# Patient Record
Sex: Male | Born: 1970 | Race: White | Hispanic: No | Marital: Single | Smoking: Current every day smoker
Health system: Southern US, Community
[De-identification: ages and names within clinical notes are randomized; demographics above are authoritative.]

## PROBLEM LIST (undated history)

## (undated) DIAGNOSIS — R519 Headache, unspecified: Secondary | ICD-10-CM

## (undated) DIAGNOSIS — R002 Palpitations: Secondary | ICD-10-CM

## (undated) DIAGNOSIS — Z8719 Personal history of other diseases of the digestive system: Secondary | ICD-10-CM

## (undated) DIAGNOSIS — R0600 Dyspnea, unspecified: Secondary | ICD-10-CM

## (undated) DIAGNOSIS — I509 Heart failure, unspecified: Secondary | ICD-10-CM

## (undated) DIAGNOSIS — K4090 Unilateral inguinal hernia, without obstruction or gangrene, not specified as recurrent: Secondary | ICD-10-CM

## (undated) DIAGNOSIS — F419 Anxiety disorder, unspecified: Secondary | ICD-10-CM

## (undated) DIAGNOSIS — J189 Pneumonia, unspecified organism: Secondary | ICD-10-CM

## (undated) DIAGNOSIS — F32A Depression, unspecified: Secondary | ICD-10-CM

## (undated) DIAGNOSIS — I499 Cardiac arrhythmia, unspecified: Secondary | ICD-10-CM

## (undated) HISTORY — PX: HERNIA REPAIR: SHX51

---

## 2005-09-10 ENCOUNTER — Emergency Department (HOSPITAL_COMMUNITY): Admission: EM | Admit: 2005-09-10 | Discharge: 2005-09-11 | Payer: Self-pay | Admitting: Emergency Medicine

## 2009-01-28 ENCOUNTER — Emergency Department (HOSPITAL_COMMUNITY): Admission: EM | Admit: 2009-01-28 | Discharge: 2009-01-29 | Payer: Self-pay | Admitting: Emergency Medicine

## 2010-08-22 LAB — COMPREHENSIVE METABOLIC PANEL
ALT: 29 U/L (ref 0–53)
Albumin: 4.5 g/dL (ref 3.5–5.2)
Alkaline Phosphatase: 76 U/L (ref 39–117)
Calcium: 9.9 mg/dL (ref 8.4–10.5)
Glucose, Bld: 84 mg/dL (ref 70–99)
Potassium: 3.8 mEq/L (ref 3.5–5.1)
Sodium: 138 mEq/L (ref 135–145)
Total Protein: 7.4 g/dL (ref 6.0–8.3)

## 2010-08-22 LAB — HEMOCCULT GUIAC POC 1CARD (OFFICE): Fecal Occult Bld: POSITIVE

## 2010-08-22 LAB — CBC
MCHC: 34.9 g/dL (ref 30.0–36.0)
Platelets: 140 10*3/uL — ABNORMAL LOW (ref 150–400)
RDW: 13.5 % (ref 11.5–15.5)

## 2010-08-22 LAB — DIFFERENTIAL
Basophils Relative: 1 % (ref 0–1)
Lymphs Abs: 1.8 10*3/uL (ref 0.7–4.0)
Monocytes Absolute: 0.9 10*3/uL (ref 0.1–1.0)
Monocytes Relative: 9 % (ref 3–12)
Neutro Abs: 6.2 10*3/uL (ref 1.7–7.7)
Neutrophils Relative %: 68 % (ref 43–77)

## 2011-02-17 ENCOUNTER — Emergency Department (HOSPITAL_COMMUNITY): Payer: Self-pay

## 2011-02-17 ENCOUNTER — Emergency Department (HOSPITAL_COMMUNITY)
Admission: EM | Admit: 2011-02-17 | Discharge: 2011-02-18 | Disposition: A | Discharge status: Home / Self Care | Payer: Self-pay | Attending: Surgery | Admitting: Surgery

## 2011-02-17 DIAGNOSIS — S46909A Unspecified injury of unspecified muscle, fascia and tendon at shoulder and upper arm level, unspecified arm, initial encounter: Secondary | ICD-10-CM | POA: Insufficient documentation

## 2011-02-17 DIAGNOSIS — S41109A Unspecified open wound of unspecified upper arm, initial encounter: Secondary | ICD-10-CM | POA: Insufficient documentation

## 2011-02-17 DIAGNOSIS — IMO0002 Reserved for concepts with insufficient information to code with codable children: Secondary | ICD-10-CM | POA: Insufficient documentation

## 2011-02-17 DIAGNOSIS — S4980XA Other specified injuries of shoulder and upper arm, unspecified arm, initial encounter: Secondary | ICD-10-CM | POA: Insufficient documentation

## 2011-02-17 LAB — COMPREHENSIVE METABOLIC PANEL
ALT: 11 U/L (ref 0–53)
Alkaline Phosphatase: 81 U/L (ref 39–117)
CO2: 22 mEq/L (ref 19–32)
Chloride: 98 mEq/L (ref 96–112)
GFR calc Af Amer: 86 mL/min — ABNORMAL LOW (ref >90)
Glucose, Bld: 90 mg/dL (ref 70–99)
Potassium: 3.1 mEq/L — ABNORMAL LOW (ref 3.5–5.1)
Sodium: 137 mEq/L (ref 135–145)
Total Bilirubin: 0.2 mg/dL — ABNORMAL LOW (ref 0.3–1.2)
Total Protein: 7.4 g/dL (ref 6.0–8.3)

## 2011-02-17 LAB — URINALYSIS, ROUTINE W REFLEX MICROSCOPIC
Bilirubin Urine: NEGATIVE
Glucose, UA: NEGATIVE mg/dL
Hgb urine dipstick: NEGATIVE
Specific Gravity, Urine: 1.006 (ref 1.005–1.030)
Urobilinogen, UA: 0.2 mg/dL (ref 0.0–1.0)
pH: 6 (ref 5.0–8.0)

## 2011-02-17 LAB — CBC
HCT: 40.4 % (ref 39.0–52.0)
Hemoglobin: 14.7 g/dL (ref 13.0–17.0)
MCH: 33.6 pg (ref 26.0–34.0)
MCHC: 36.4 g/dL — ABNORMAL HIGH (ref 30.0–36.0)
MCV: 92.2 fL (ref 78.0–100.0)
RDW: 12.6 % (ref 11.5–15.5)

## 2011-02-17 LAB — POCT I-STAT, CHEM 8
Calcium, Ion: 1.1 mmol/L — ABNORMAL LOW (ref 1.12–1.32)
Chloride: 100 meq/L (ref 96–112)
Creatinine, Ser: 1.4 mg/dL — ABNORMAL HIGH (ref 0.50–1.35)
Glucose, Bld: 87 mg/dL (ref 70–99)
Potassium: 3.3 meq/L — ABNORMAL LOW (ref 3.5–5.1)

## 2011-02-17 LAB — RAPID URINE DRUG SCREEN, HOSP PERFORMED
Barbiturates: NOT DETECTED
Benzodiazepines: NOT DETECTED

## 2011-02-17 MED ORDER — IOHEXOL 300 MG/ML  SOLN
100.0000 mL | Freq: Once | INTRAMUSCULAR | Status: AC | PRN
  Administered 2011-02-17: 100 mL via INTRAVENOUS

## 2011-02-23 ENCOUNTER — Telehealth (INDEPENDENT_AMBULATORY_CARE_PROVIDER_SITE_OTHER): Payer: Self-pay | Admitting: Orthopedic Surgery

## 2011-02-23 NOTE — Telephone Encounter (Signed)
Spouse called for follow-up. Left message to call the ED as we did not see nor were we contacted about care.

## 2013-05-14 IMAGING — CT CT CHEST W/ CM
2 of 4 series · 15 of 36 positions shown, 18 images · IV contrast (80ml omni 300)
Comparison: AP chest radiograph - earlier same day; CT abdomen
pelvis - 01/29/2009

CLINICAL DATA: Stuck with tree branch in left axilla

CT CHEST WITH CONTRAST
TECHNIQUE: Multidetector CT imaging of the chest was performed
following the standard protocol during bolus administration of
intravenous contrast.
Contrast: 100mL OMNIPAQUE IOHEXOL 300 MG/ML IV SOLN

[Series 2: routine chest · axial · 0.75mm/px · z∈[-322,-28]mm · 12 of 69 slices shown, 15 images]
[im 5/69  mediastinal]
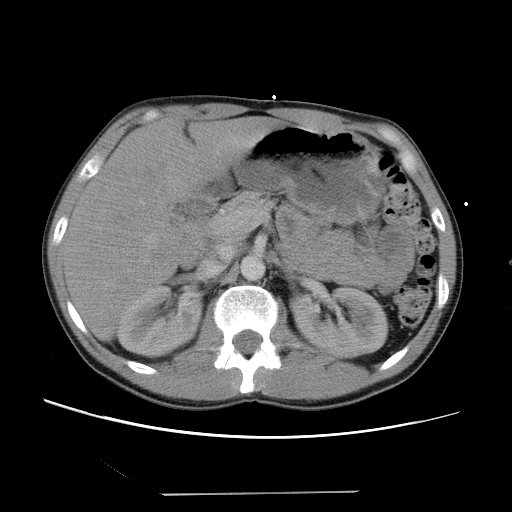
[im 5/69  lung]
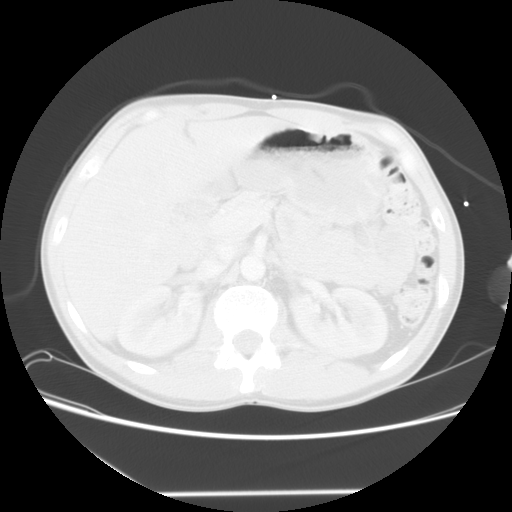
[im 10/69  lung]
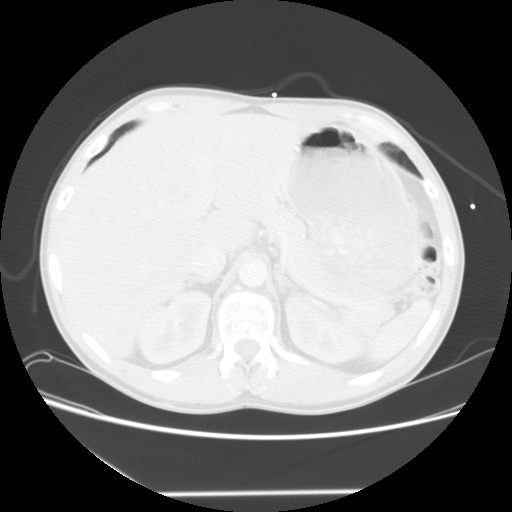
[im 15/69  lung]
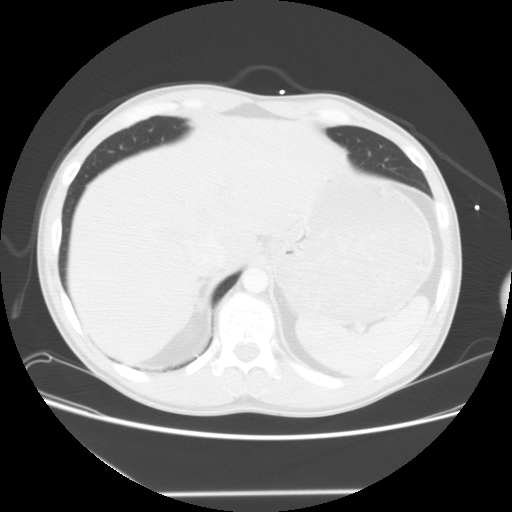
[im 20/69  lung]
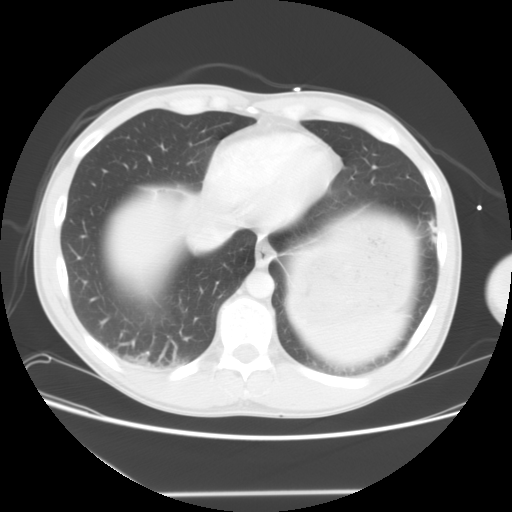
[im 25/69  mediastinal]
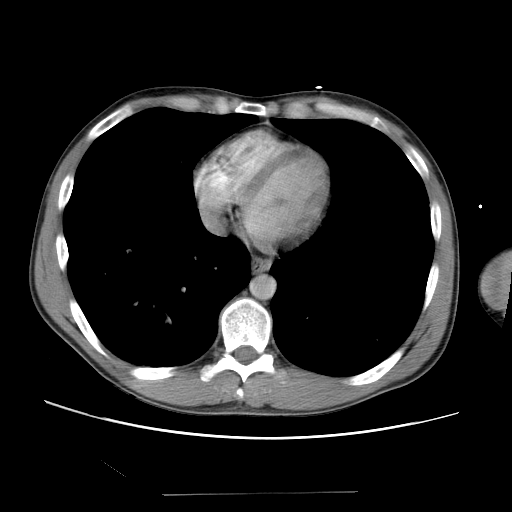
[im 25/69  lung]
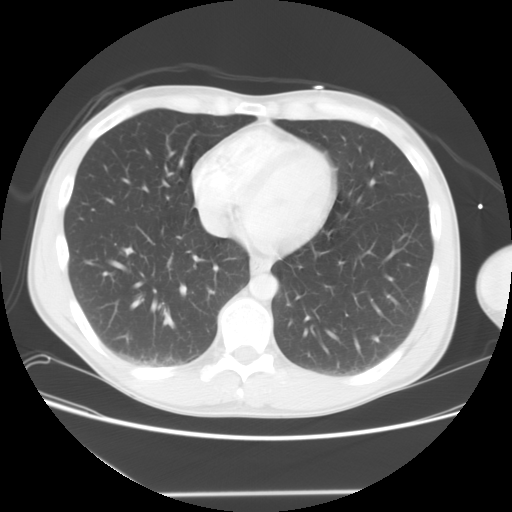
[im 30/69  lung]
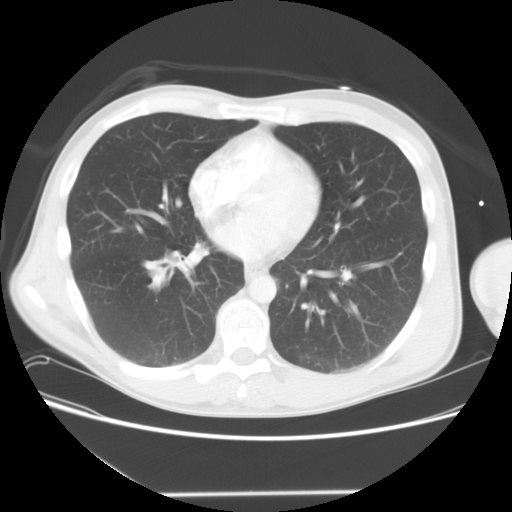
[im 39/69  lung]
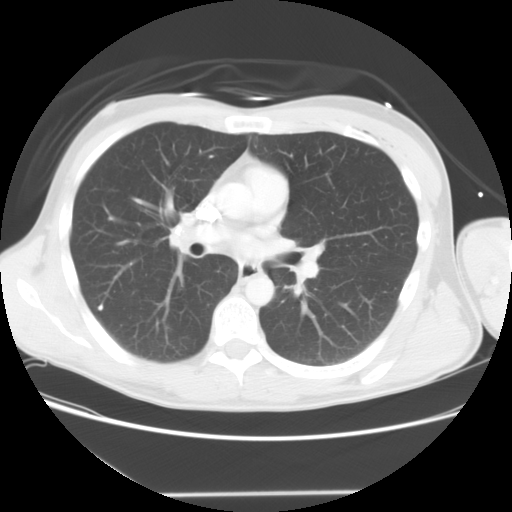
[im 44/69  lung]
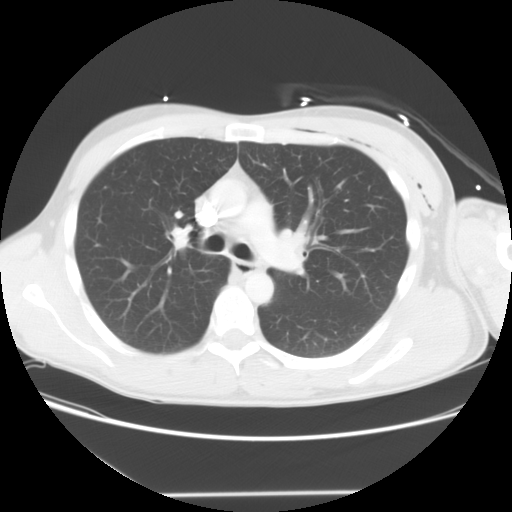
[im 49/69  mediastinal]
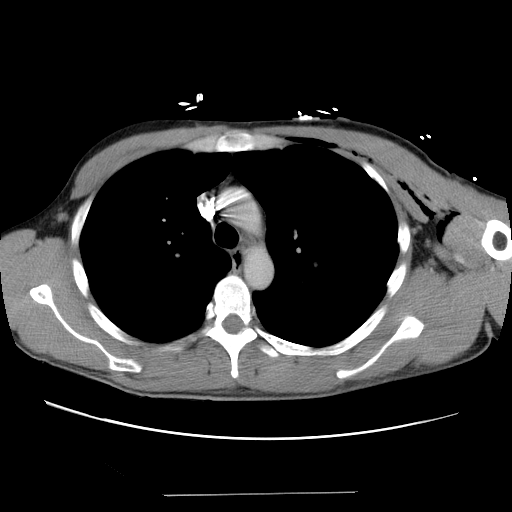
[im 49/69  lung]
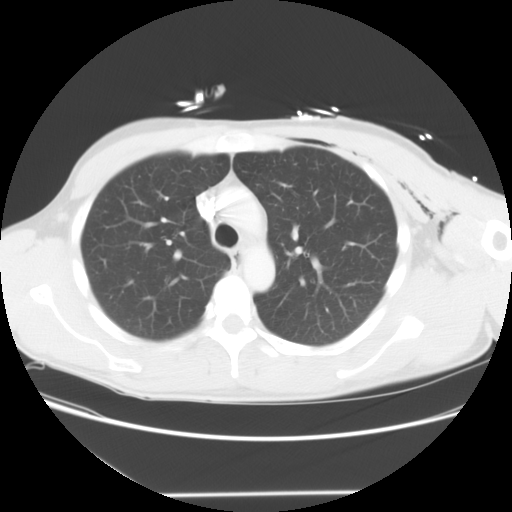
[im 54/69  lung]
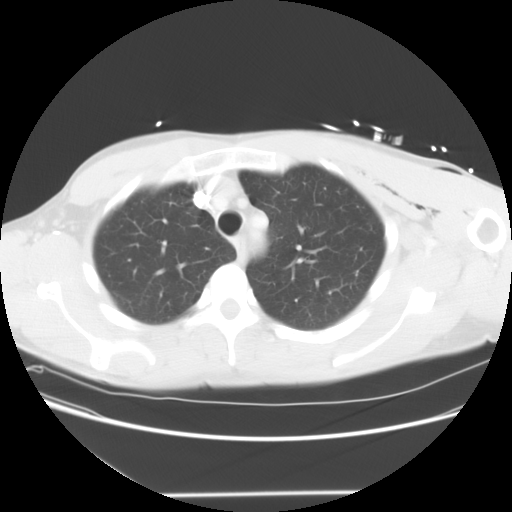
[im 59/69  lung]
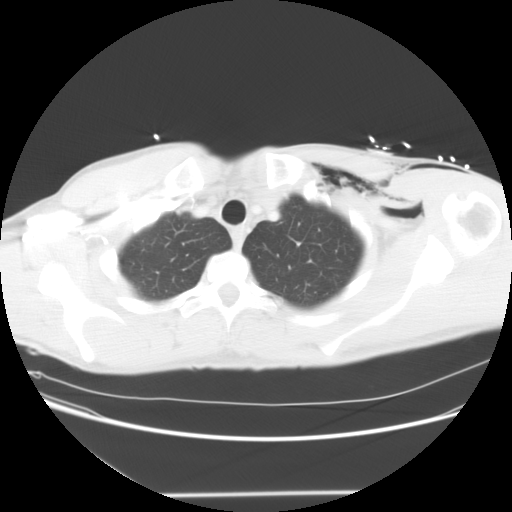
[im 64/69  lung]
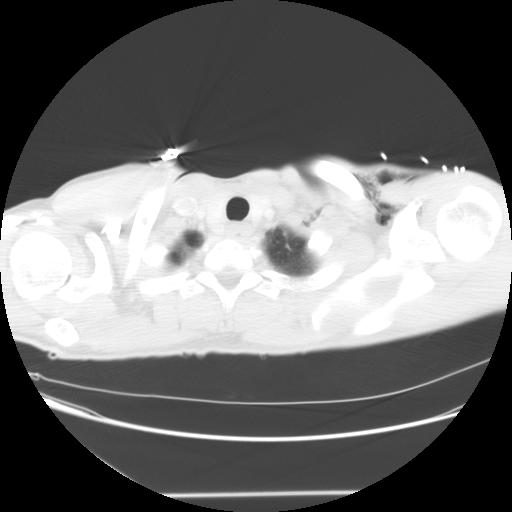

[Series 401: cor · coronal · 0.75mm/px · 3 of 87 slices shown]
[im 18/87  lung]
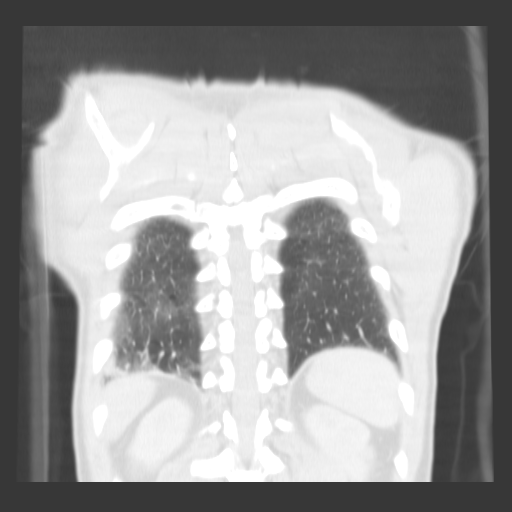
[im 35/87  lung]
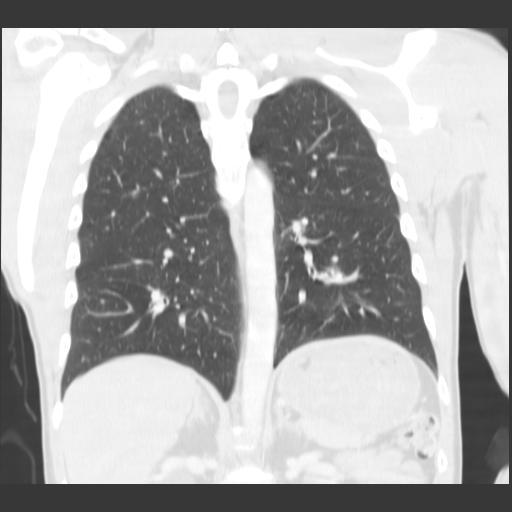
[im 52/87  lung]
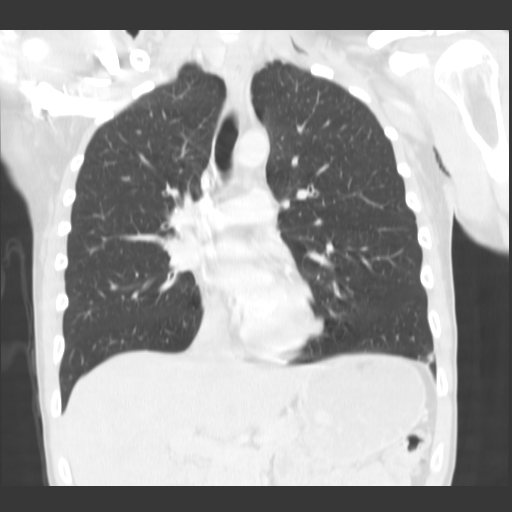

[15 of 36 positions shown; findings below may reference images not displayed]

FINDINGS: There is a moderate amount of subcutaneous emphysema about the left
axilla extending along the anterior aspect of the left chest and to
the left supraclavicular region.  No associated pneumothorax.  No
discrete radiopaque foreign body.  No displaced rib fractures.
There is no discrete area of active extravasation within the left
axilla.

Calcified granuloma within right upper lobe with associated with
partially calcified right hilar lymph nodes, the sequela of prior
granulomatous infection.  Minimal bibasilar dependent ground-glass
atelectasis.  No focal airspace opacities.  No pleural effusion.
Central airways are patent.

Normal heart size.  No pericardial effusion.  No mediastinal, hilar
or axillary lymphadenopathy. Normal configuration of the aortic
arch.  Normal caliber thoracic aorta.

Limited early phase evaluation of the upper abdomen suggest mild
dilatation of the bilateral renal collecting systems without
definite evidence of hydronephrosis.
IMPRESSION: 1.  Moderate amount of subcutaneous emphysema within the left
axilla without associated pneumothorax, fracture or radiopaque
foreign body.

2. Sequela of prior granulomatous infection.

## 2017-11-25 ENCOUNTER — Ambulatory Visit (HOSPITAL_BASED_OUTPATIENT_CLINIC_OR_DEPARTMENT_OTHER): Payer: Self-pay

## 2017-11-25 ENCOUNTER — Other Ambulatory Visit: Payer: Self-pay

## 2017-11-25 ENCOUNTER — Encounter (HOSPITAL_BASED_OUTPATIENT_CLINIC_OR_DEPARTMENT_OTHER): Payer: Self-pay | Admitting: Emergency Medicine

## 2017-11-25 ENCOUNTER — Emergency Department (HOSPITAL_BASED_OUTPATIENT_CLINIC_OR_DEPARTMENT_OTHER): Payer: Self-pay

## 2017-11-25 ENCOUNTER — Emergency Department (HOSPITAL_BASED_OUTPATIENT_CLINIC_OR_DEPARTMENT_OTHER)
Admission: EM | Admit: 2017-11-25 | Discharge: 2017-11-25 | Discharge status: Against Medical Advice | Payer: Self-pay | Attending: Emergency Medicine | Admitting: Emergency Medicine

## 2017-11-25 DIAGNOSIS — M25551 Pain in right hip: Secondary | ICD-10-CM | POA: Insufficient documentation

## 2017-11-25 DIAGNOSIS — Z5321 Procedure and treatment not carried out due to patient leaving prior to being seen by health care provider: Secondary | ICD-10-CM | POA: Insufficient documentation

## 2017-11-25 HISTORY — DX: Unilateral inguinal hernia, without obstruction or gangrene, not specified as recurrent: K40.90

## 2017-11-25 HISTORY — DX: Palpitations: R00.2

## 2017-11-25 NOTE — ED Triage Notes (Signed)
Pt states he fell off a ladder yesterday while cleaning the gutters out  Pt states he was approximately 12-15 feet up PT has bruising to his right hip, right foot, and back pain  Denies head injury or LOC

## 2017-11-29 NOTE — ED Notes (Signed)
Follow up call made  No answer  11/29/17  0835  s Benyamin Jeff rn

## 2020-09-16 ENCOUNTER — Encounter (HOSPITAL_COMMUNITY)
Admission: EM | Disposition: A | Discharge status: Home / Self Care | Payer: Self-pay | Source: Home / Self Care | Attending: Emergency Medicine

## 2020-09-16 ENCOUNTER — Emergency Department (HOSPITAL_BASED_OUTPATIENT_CLINIC_OR_DEPARTMENT_OTHER): Payer: No Typology Code available for payment source

## 2020-09-16 ENCOUNTER — Ambulatory Visit (HOSPITAL_BASED_OUTPATIENT_CLINIC_OR_DEPARTMENT_OTHER)
Admission: EM | Admit: 2020-09-16 | Discharge: 2020-09-16 | Disposition: A | Discharge status: Home / Self Care | Payer: No Typology Code available for payment source | Attending: Emergency Medicine | Admitting: Emergency Medicine

## 2020-09-16 ENCOUNTER — Emergency Department (HOSPITAL_COMMUNITY): Payer: No Typology Code available for payment source | Admitting: Certified Registered Nurse Anesthetist

## 2020-09-16 ENCOUNTER — Other Ambulatory Visit: Payer: Self-pay

## 2020-09-16 ENCOUNTER — Encounter (HOSPITAL_BASED_OUTPATIENT_CLINIC_OR_DEPARTMENT_OTHER): Payer: Self-pay

## 2020-09-16 DIAGNOSIS — Z79899 Other long term (current) drug therapy: Secondary | ICD-10-CM | POA: Diagnosis not present

## 2020-09-16 DIAGNOSIS — Z20822 Contact with and (suspected) exposure to covid-19: Secondary | ICD-10-CM | POA: Diagnosis not present

## 2020-09-16 DIAGNOSIS — W268XXA Contact with other sharp object(s), not elsewhere classified, initial encounter: Secondary | ICD-10-CM | POA: Insufficient documentation

## 2020-09-16 DIAGNOSIS — F17210 Nicotine dependence, cigarettes, uncomplicated: Secondary | ICD-10-CM | POA: Diagnosis not present

## 2020-09-16 DIAGNOSIS — Z23 Encounter for immunization: Secondary | ICD-10-CM | POA: Diagnosis not present

## 2020-09-16 DIAGNOSIS — Z880 Allergy status to penicillin: Secondary | ICD-10-CM | POA: Insufficient documentation

## 2020-09-16 DIAGNOSIS — Y9389 Activity, other specified: Secondary | ICD-10-CM | POA: Insufficient documentation

## 2020-09-16 DIAGNOSIS — Y990 Civilian activity done for income or pay: Secondary | ICD-10-CM | POA: Diagnosis not present

## 2020-09-16 DIAGNOSIS — S61441A Puncture wound with foreign body of right hand, initial encounter: Secondary | ICD-10-CM | POA: Diagnosis not present

## 2020-09-16 DIAGNOSIS — Z7982 Long term (current) use of aspirin: Secondary | ICD-10-CM | POA: Insufficient documentation

## 2020-09-16 DIAGNOSIS — S60551A Superficial foreign body of right hand, initial encounter: Secondary | ICD-10-CM

## 2020-09-16 DIAGNOSIS — W458XXA Other foreign body or object entering through skin, initial encounter: Secondary | ICD-10-CM | POA: Insufficient documentation

## 2020-09-16 HISTORY — DX: Personal history of other diseases of the digestive system: Z87.19

## 2020-09-16 HISTORY — DX: Heart failure, unspecified: I50.9

## 2020-09-16 HISTORY — DX: Headache, unspecified: R51.9

## 2020-09-16 HISTORY — DX: Cardiac arrhythmia, unspecified: I49.9

## 2020-09-16 HISTORY — DX: Anxiety disorder, unspecified: F41.9

## 2020-09-16 HISTORY — DX: Pneumonia, unspecified organism: J18.9

## 2020-09-16 HISTORY — PX: I & D EXTREMITY: SHX5045

## 2020-09-16 HISTORY — DX: Dyspnea, unspecified: R06.00

## 2020-09-16 HISTORY — DX: Depression, unspecified: F32.A

## 2020-09-16 LAB — RESP PANEL BY RT-PCR (FLU A&B, COVID) ARPGX2
Influenza A by PCR: NEGATIVE
Influenza B by PCR: NEGATIVE
SARS Coronavirus 2 by RT PCR: NEGATIVE

## 2020-09-16 LAB — BASIC METABOLIC PANEL
Anion gap: 8 (ref 5–15)
BUN: 9 mg/dL (ref 6–20)
CO2: 26 mmol/L (ref 22–32)
Calcium: 9.3 mg/dL (ref 8.9–10.3)
Chloride: 102 mmol/L (ref 98–111)
Creatinine, Ser: 0.9 mg/dL (ref 0.61–1.24)
GFR, Estimated: >60 mL/min (ref >60)
Glucose, Bld: 81 mg/dL (ref 70–99)
Potassium: 4.2 mmol/L (ref 3.5–5.1)
Sodium: 136 mmol/L (ref 135–145)

## 2020-09-16 LAB — SURGICAL PCR SCREEN
MRSA, PCR: NEGATIVE
Staphylococcus aureus: NEGATIVE

## 2020-09-16 SURGERY — IRRIGATION AND DEBRIDEMENT EXTREMITY
Anesthesia: General | Laterality: Right

## 2020-09-16 MED ORDER — DEXAMETHASONE SODIUM PHOSPHATE 10 MG/ML IJ SOLN
INTRAMUSCULAR | Status: DC | PRN
  Administered 2020-09-16: 5 mg via INTRAVENOUS

## 2020-09-16 MED ORDER — FENTANYL CITRATE (PF) 250 MCG/5ML IJ SOLN
INTRAMUSCULAR | Status: AC
  Filled 2020-09-16: qty 10

## 2020-09-16 MED ORDER — PROPOFOL 10 MG/ML IV BOLUS
INTRAVENOUS | Status: AC
  Filled 2020-09-16: qty 20

## 2020-09-16 MED ORDER — MIDAZOLAM HCL 2 MG/2ML IJ SOLN
0.5000 mg | Freq: Once | INTRAMUSCULAR | Status: DC | PRN
Start: 2020-09-16 — End: 2020-09-16

## 2020-09-16 MED ORDER — ONDANSETRON HCL 4 MG/2ML IJ SOLN
INTRAMUSCULAR | Status: DC | PRN
  Administered 2020-09-16: 4 mg via INTRAVENOUS

## 2020-09-16 MED ORDER — FENTANYL CITRATE (PF) 250 MCG/5ML IJ SOLN
INTRAMUSCULAR | Status: DC | PRN
  Administered 2020-09-16 (×3): 50 ug via INTRAVENOUS

## 2020-09-16 MED ORDER — ORAL CARE MOUTH RINSE: 15.0000 mL | Freq: Once | OROMUCOSAL | Status: AC

## 2020-09-16 MED ORDER — CHLORHEXIDINE GLUCONATE 4 % EX LIQD: 60.0000 mL | Freq: Once | CUTANEOUS | Status: DC

## 2020-09-16 MED ORDER — BUPIVACAINE HCL (PF) 0.25 % IJ SOLN
INTRAMUSCULAR | Status: DC | PRN
  Administered 2020-09-16: 10 mL

## 2020-09-16 MED ORDER — LACTATED RINGERS IV SOLN: INTRAVENOUS | Status: DC

## 2020-09-16 MED ORDER — TETANUS-DIPHTH-ACELL PERTUSSIS 5-2.5-18.5 LF-MCG/0.5 IM SUSY
0.5000 mL | PREFILLED_SYRINGE | Freq: Once | INTRAMUSCULAR | Status: AC
  Administered 2020-09-16: 0.5 mL via INTRAMUSCULAR
  Filled 2020-09-16: qty 0.5

## 2020-09-16 MED ORDER — MIDAZOLAM HCL 2 MG/2ML IJ SOLN
INTRAMUSCULAR | Status: AC
  Filled 2020-09-16: qty 2

## 2020-09-16 MED ORDER — VANCOMYCIN HCL IN DEXTROSE 1-5 GM/200ML-% IV SOLN
INTRAVENOUS | Status: AC
  Administered 2020-09-16: 1000 mg via INTRAVENOUS
  Filled 2020-09-16: qty 200

## 2020-09-16 MED ORDER — LIDOCAINE 2% (20 MG/ML) 5 ML SYRINGE
INTRAMUSCULAR | Status: DC | PRN
  Administered 2020-09-16: 40 mg via INTRAVENOUS

## 2020-09-16 MED ORDER — BUPIVACAINE HCL (PF) 0.25 % IJ SOLN
INTRAMUSCULAR | Status: AC
  Filled 2020-09-16: qty 30

## 2020-09-16 MED ORDER — CHLORHEXIDINE GLUCONATE 0.12 % MT SOLN: 15.0000 mL | Freq: Once | OROMUCOSAL | Status: AC

## 2020-09-16 MED ORDER — OXYCODONE HCL 5 MG PO TABS: 5.0000 mg | ORAL_TABLET | Freq: Once | ORAL | Status: DC | PRN

## 2020-09-16 MED ORDER — HYDROMORPHONE HCL 1 MG/ML IJ SOLN: 0.2500 mg | INTRAMUSCULAR | Status: DC | PRN

## 2020-09-16 MED ORDER — MIDAZOLAM HCL 2 MG/2ML IJ SOLN
INTRAMUSCULAR | Status: DC | PRN
  Administered 2020-09-16: 2 mg via INTRAVENOUS

## 2020-09-16 MED ORDER — VASOPRESSIN 20 UNIT/ML IV SOLN
INTRAVENOUS | Status: AC
  Filled 2020-09-16: qty 1

## 2020-09-16 MED ORDER — MEPERIDINE HCL 25 MG/ML IJ SOLN: 6.2500 mg | INTRAMUSCULAR | Status: DC | PRN

## 2020-09-16 MED ORDER — POVIDONE-IODINE 10 % EX SWAB: 2.0000 "application " | Freq: Once | CUTANEOUS | Status: DC

## 2020-09-16 MED ORDER — PROPOFOL 10 MG/ML IV BOLUS
INTRAVENOUS | Status: DC | PRN
  Administered 2020-09-16: 30 mg via INTRAVENOUS
  Administered 2020-09-16: 100 mg via INTRAVENOUS

## 2020-09-16 MED ORDER — EPHEDRINE SULFATE-NACL 50-0.9 MG/10ML-% IV SOSY
PREFILLED_SYRINGE | INTRAVENOUS | Status: DC | PRN
  Administered 2020-09-16: 15 mg via INTRAVENOUS
  Administered 2020-09-16: 10 mg via INTRAVENOUS

## 2020-09-16 MED ORDER — VANCOMYCIN HCL IN DEXTROSE 1-5 GM/200ML-% IV SOLN: 1000.0000 mg | INTRAVENOUS | Status: AC

## 2020-09-16 MED ORDER — SODIUM CHLORIDE 0.9 % IR SOLN
Status: DC | PRN
  Administered 2020-09-16: 1000 mL

## 2020-09-16 MED ORDER — OXYCODONE HCL 5 MG/5ML PO SOLN: 5.0000 mg | Freq: Once | ORAL | Status: DC | PRN

## 2020-09-16 MED ORDER — CLINDAMYCIN HCL 300 MG PO CAPS: 300.0000 mg | ORAL_CAPSULE | Freq: Three times a day (TID) | ORAL | 0 refills | Status: AC

## 2020-09-16 MED ORDER — CHLORHEXIDINE GLUCONATE 0.12 % MT SOLN
OROMUCOSAL | Status: AC
  Administered 2020-09-16: 15 mL via OROMUCOSAL
  Filled 2020-09-16: qty 15

## 2020-09-16 MED ORDER — FENTANYL CITRATE (PF) 250 MCG/5ML IJ SOLN
INTRAMUSCULAR | Status: AC
  Filled 2020-09-16: qty 5

## 2020-09-16 MED ORDER — PROMETHAZINE HCL 25 MG/ML IJ SOLN: 6.2500 mg | INTRAMUSCULAR | Status: DC | PRN

## 2020-09-16 SURGICAL SUPPLY — 28 items
BAG DECANTER FOR FLEXI CONT (MISCELLANEOUS) ×2 IMPLANT
BNDG ELASTIC 3X5.8 VLCR STR LF (GAUZE/BANDAGES/DRESSINGS) ×1 IMPLANT
BNDG GAUZE ELAST 4 BULKY (GAUZE/BANDAGES/DRESSINGS) ×1 IMPLANT
DRAPE SURG 17X23 STRL (DRAPES) ×2 IMPLANT
GAUZE SPONGE 4X4 12PLY STRL (GAUZE/BANDAGES/DRESSINGS) ×2 IMPLANT
GAUZE XEROFORM 5X9 LF (GAUZE/BANDAGES/DRESSINGS) ×1 IMPLANT
GLOVE BIOGEL M 8.0 STRL (GLOVE) ×2 IMPLANT
GOWN STRL REUS W/ TWL LRG LVL3 (GOWN DISPOSABLE) ×2 IMPLANT
GOWN STRL REUS W/TWL LRG LVL3 (GOWN DISPOSABLE) ×4
HANDPIECE INTERPULSE COAX TIP (DISPOSABLE) ×2
KIT BASIN OR (CUSTOM PROCEDURE TRAY) ×2 IMPLANT
KIT TURNOVER KIT B (KITS) ×2 IMPLANT
NDL HYPO 25GX1X1/2 BEV (NEEDLE) IMPLANT
NEEDLE HYPO 25GX1X1/2 BEV (NEEDLE) ×2 IMPLANT
NS IRRIG 1000ML POUR BTL (IV SOLUTION) ×2 IMPLANT
PACK ORTHO EXTREMITY (CUSTOM PROCEDURE TRAY) ×2 IMPLANT
PAD ARMBOARD 7.5X6 YLW CONV (MISCELLANEOUS) ×4 IMPLANT
SET HNDPC FAN SPRY TIP SCT (DISPOSABLE) IMPLANT
SOAP 2 % CHG 4 OZ (WOUND CARE) ×2 IMPLANT
SPONGE LAP 4X18 RFD (DISPOSABLE) ×2 IMPLANT
SUT ETHILON 5 0 P 3 18 (SUTURE) ×2
SUT NYLON ETHILON 5-0 P-3 1X18 (SUTURE) IMPLANT
SYR CONTROL 10ML LL (SYRINGE) ×1 IMPLANT
TOWEL GREEN STERILE (TOWEL DISPOSABLE) ×2 IMPLANT
TOWEL GREEN STERILE FF (TOWEL DISPOSABLE) ×2 IMPLANT
TUBE CONNECTING 12X1/4 (SUCTIONS) ×2 IMPLANT
WATER STERILE IRR 1000ML POUR (IV SOLUTION) ×2 IMPLANT
YANKAUER SUCT BULB TIP NO VENT (SUCTIONS) ×2 IMPLANT

## 2020-09-16 NOTE — ED Provider Notes (Signed)
[facility] EMERGENCY DEPARTMENT Provider Note   CSN: [id number] Arrival date & time: 09/16/20  6213     History Chief Complaint  Patient presents with  . Hand Injury    Samuel Khan is a 50 y.o. male.  50 year old male who presents with right hand injury.  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  The wood broke off into his hand and he has not been able to get it out.  Pain is mild to moderate and constant.  He reports normal sensation.  He is right-handed.  No medications prior to arrival.  He does not know last tetanus vaccination.  Last food intake was yesterday at suppertime. He has had coffee this morning but no food.  The history is provided by the patient.  Hand Injury      Past Medical History:  Diagnosis Date  . Hernia of testicle   . Palpitations     There are no problems to display for this patient.   Past Surgical History:  Procedure Laterality Date  . HERNIA REPAIR         No family history on file.  Social History   Tobacco Use  . Smoking status: Current Every Day Smoker    Packs/day: 1.50    Types: Cigarettes  . Smokeless tobacco: Never Used  Substance Use Topics  . Alcohol use: Not Currently  . Drug use: Yes    Types: Marijuana    Home Medications Prior to Admission medications   Medication Sig Start Date End Date Taking? Authorizing Provider  aspirin 81 MG EC tablet Take 1 tablet by mouth daily. 05/20/20  Yes [provider]  metoprolol tartrate (LOPRESSOR) 100 MG tablet Take by mouth. 05/20/20  Yes [provider]    Allergies    Ampicillin and Penicillins  Review of Systems   Review of Systems All other systems reviewed and are negative except that which was mentioned in HPI  Physical Exam Updated Vital Signs BP (!) 135/104 (BP Location: Right Arm)   Pulse (!) 58   Temp 97.6 F (36.4 C) (Oral)   Resp 16   Ht 6' (1.829  m)   Wt 70.3 kg   SpO2 97%   BMI 21.02 kg/m   Physical Exam Vitals and nursing note reviewed.  Constitutional:      General: He is not in acute distress.    Appearance: He is well-developed.  HENT:     Head: Normocephalic and atraumatic.  Eyes:     Conjunctiva/sclera: Conjunctivae normal.  Cardiovascular:     Pulses: Normal pulses.  Musculoskeletal:        General: Normal range of motion.     Cervical back: Neck supple.     Comments: Puncture wound in web space between 3rd and 4th fingers on R hand; linear mass palpable under skin on palm just proximal to web space; normal ROM and sensation fingers  Skin:    General: Skin is warm and dry.  Neurological:     Mental Status: He is alert and oriented to person, place, and time.  Psychiatric:        Judgment: Judgment normal.     ED Results / Procedures / Treatments   Labs (all labs ordered are listed, but only abnormal results are displayed) Labs Reviewed  RESP PANEL BY RT-PCR (FLU A&B, COVID) [id number]    EKG None  Radiology DG  Hand Complete Right  Result Date: 09/16/2020 CLINICAL DATA:  Piece of wood broke off in RIGHT hand, into palm between RIGHT ring and middle fingers EXAM: RIGHT HAND - COMPLETE 3+ VIEW COMPARISON:  None FINDINGS: Radiopaque foreign body 4 mm length at first web space. No other radiopaque foreign bodies identified. Soft tissue gas identified at the radial aspect, base of proximal phalanx ring finger. Osseous mineralization normal. Old healed fracture distal RIGHT fifth metacarpal. No acute fracture, dislocation, or bone destruction. Scattered degenerative changes of IP joints. IMPRESSION: Soft tissue gas identified at radial aspect base of proximal phalanx RIGHT ring finger without associated radiopaque foreign body. Please note that wood is typically radiolucent. 4 mm radiopaque foreign body at first web space. Old healed fifth metacarpal fracture. Electronically Signed   By: Ulyses Southward M.D.   On:  09/16/2020 08:52    Procedures Procedures   Medications Ordered in ED Medications  Tdap (BOOSTRIX) injection 0.5 mL (0.5 mLs Intramuscular Given 09/16/20 0932)    ED Course  I have reviewed the triage vital signs and the nursing notes.  Pertinent labs & imaging results that were available during my care of the patient were reviewed by me and considered in my medical decision making (see chart for details).    MDM Rules/Calculators/A&P                          Neurovascularly intact.  Plain films show no underlying fracture, some gas tracking along puncture site and foreign body is palpable on physical exam.  Suspect he will require surgical removal of this foreign body.  Updated tetanus and discussed with hand surgeon on-call, Izora Ribas, who will see pt in [facility] ED. discussed ED to ED transfer with Dr. Audley Hose.  Last food intake was before midnight last night; he has had some coffee this morning. COVID screening sent. I have discussed that he needs to remain strict n.p.o. prior to surgeon evaluation.  He is stable for private vehicle transfer and patient will be transported by his boss.  Final Clinical Impression(s) / ED Diagnoses Final diagnoses:  Foreign body of right hand, initial encounter    Rx / DC Orders ED Discharge Orders    None       Crespin Forstrom, Ambrose Finland, MD 09/16/20 1013

## 2020-09-16 NOTE — Progress Notes (Signed)
I have seen and examined this patient, agree with PA Leotis Shames.  Will proceed with FB removal, washout. Discussed procedure and risk of infection.

## 2020-09-16 NOTE — Consult Note (Signed)
Reason for Consult:Right hand FB Referring Physician: Cordelia Poche Time called: 1057 Time at bedside: 1243   Samuel Khan is an 50 y.o. male.  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 Although it didn't hurt terribly he could feel the piece of wood in his hand and came to the ED for evaluation and hand surgery was consulted. He is RHD.  Past Medical History:  Diagnosis Date  . Hernia of testicle   . Palpitations     Past Surgical History:  Procedure Laterality Date  . HERNIA REPAIR      No family history on file.  Social History:  reports that he has been smoking cigarettes. He has been smoking about 1.50 packs per day. He has never used smokeless tobacco. He reports previous alcohol use. He reports current drug use. Drug: Marijuana.  Allergies:  Allergies  Allergen Reactions  . Ampicillin   . Penicillins     Medications: I have reviewed the patient's current medications.  Results for orders placed or performed during the hospital encounter of 09/16/20 (from the past 48 hour(s))  Resp Panel by RT-PCR (Flu A&B, Covid) Nasopharyngeal Swab     Status: None   Collection Time: 09/16/20  9:30 AM   Specimen: Nasopharyngeal Swab; Nasopharyngeal(NP) swabs in vial transport medium  Result Value Ref Range   SARS Coronavirus 2 by RT PCR NEGATIVE NEGATIVE    Comment: (NOTE) SARS-CoV-2 target nucleic acids are NOT DETECTED.  The SARS-CoV-2 RNA is generally detectable in upper respiratory specimens during the acute phase of infection. The lowest concentration of SARS-CoV-2 viral copies this assay can detect is 138 copies/mL. A negative result does not preclude SARS-Cov-2 infection and should not be used as the sole basis for treatment or other patient management decisions. A negative result may occur with  improper specimen collection/handling, submission of specimen other than nasopharyngeal  swab, presence of viral mutation(s) within the areas targeted by this assay, and inadequate number of viral copies(<138 copies/mL). A negative result must be combined with clinical observations, patient history, and epidemiological information. The expected result is Negative.  Fact Sheet for Patients:  BloggerCourse.com  Fact Sheet for Healthcare Providers:  SeriousBroker.it  This test is no t yet approved or cleared by the [place] FDA and  has been authorized for detection and/or diagnosis of SARS-CoV-2 by FDA under an Emergency Use Authorization (EUA). This EUA will remain  in effect (meaning this test can be used) for the duration of the COVID-19 declaration under Section 564(b)(1) of the Act, 21 U.S.C.section 360bbb-3(b)(1), unless the authorization is terminated  or revoked sooner.       Influenza A by PCR NEGATIVE NEGATIVE   Influenza B by PCR NEGATIVE NEGATIVE    Comment: (NOTE) The Xpert Xpress SARS-CoV-2/FLU/RSV plus assay is intended as an aid in the diagnosis of influenza from Nasopharyngeal swab specimens and should not be used as a sole basis for treatment. Nasal washings and aspirates are unacceptable for Xpert Xpress SARS-CoV-2/FLU/RSV testing.  Fact Sheet for Patients: BloggerCourse.com  Fact Sheet for Healthcare Providers: SeriousBroker.it  This test is not yet approved or cleared by the [place] FDA and has been authorized for detection and/or diagnosis of SARS-CoV-2 by FDA under an Emergency Use Authorization (EUA). This EUA will remain in effect (meaning this test can be used) for the duration of the COVID-19 declaration under Section 564(b)(1) of the Act,  21 U.S.C. section 360bbb-3(b)(1), unless the authorization is terminated or revoked.  Performed at [facility], [address] [place], [place] [address]     DG Hand  Complete Right  Result Date: 09/16/2020 CLINICAL DATA:  Piece of wood broke off in RIGHT hand, into palm between RIGHT ring and middle fingers EXAM: RIGHT HAND - COMPLETE 3+ VIEW COMPARISON:  None FINDINGS: Radiopaque foreign body 4 mm length at first web space. No other radiopaque foreign bodies identified. Soft tissue gas identified at the radial aspect, base of proximal phalanx ring finger. Osseous mineralization normal. Old healed fracture distal RIGHT fifth metacarpal. No acute fracture, dislocation, or bone destruction. Scattered degenerative changes of IP joints. IMPRESSION: Soft tissue gas identified at radial aspect base of proximal phalanx RIGHT ring finger without associated radiopaque foreign body. Please note that wood is typically radiolucent. 4 mm radiopaque foreign body at first web space. Old healed fifth metacarpal fracture. Electronically Signed   By: Ulyses Southward M.D.   On: 09/16/2020 08:52    Review of Systems  HENT: Negative for ear discharge, ear pain, hearing loss and tinnitus.   Eyes: Negative for photophobia and pain.  Respiratory: Negative for cough and shortness of breath.   Cardiovascular: Negative for chest pain.  Gastrointestinal: Negative for abdominal pain, nausea and vomiting.  Genitourinary: Negative for dysuria, flank pain, frequency and urgency.  Musculoskeletal: Positive for myalgias (Right hand). Negative for back pain and neck pain.  Neurological: Negative for dizziness and headaches.  Hematological: Does not bruise/bleed easily.  Psychiatric/Behavioral: The patient is not nervous/anxious.    Blood pressure 118/86, pulse (!) 47, temperature 98.5 F (36.9 C), temperature source Oral, resp. rate 16, height 6' (1.829 m), weight 70.3 kg, SpO2 95 %. Physical Exam Constitutional:      General: He is not in acute distress.    Appearance: He is well-developed. He is not diaphoretic.  HENT:     Head: Normocephalic and atraumatic.  Eyes:     General: No scleral  icterus.       Right eye: No discharge.        Left eye: No discharge.     Conjunctiva/sclera: Conjunctivae normal.  Cardiovascular:     Rate and Rhythm: Normal rate and regular rhythm.  Pulmonary:     Effort: Pulmonary effort is normal. No respiratory distress.  Musculoskeletal:     Cervical back: Normal range of motion.     Comments: Right shoulder, elbow, wrist, digits- Small wound base of dorsal ring finger, palpable FB palmar extending ~4cm, mild TTP, no instability, no blocks to motion  Sens  Ax/R/M/U intact  Mot   Ax/ R/ PIN/ M/ AIN/ U intact  Rad 2+  Skin:    General: Skin is warm and dry.  Neurological:     Mental Status: He is alert.  Psychiatric:        Behavior: Behavior normal.     Assessment/Plan: FB right hand -- Plan I&D, FB removal today by Dr. Izora Ribas. Anticipate discharge after surgery.    Freeman Caldron, PA-C Orthopedic Surgery [phone] 09/16/2020, 1:07 PM

## 2020-09-16 NOTE — Discharge Instructions (Signed)
In am, removed dressing, remove packing from wounds, then apply a small amount of antibiotic ointment on wounds and keep clean and covered.

## 2020-09-16 NOTE — ED Triage Notes (Signed)
Pt arrives with piece of wood broken off into right hand. States he was at work and piece went between his fingers breaking off into palm between ring and middle finger.

## 2020-09-16 NOTE — Transfer of Care (Signed)
Immediate Anesthesia Transfer of Care Note  Patient: Ardeen Fillers  Procedure(s) Performed: IRRIGATION AND DEBRIDEMENT RIGHT HAND (Right )  Patient Location: PACU  Anesthesia Type:General  Level of Consciousness: drowsy, patient cooperative and responds to stimulation  Airway & Oxygen Therapy: Patient Spontanous Breathing  Post-op Assessment: Report given to RN and Post -op Vital signs reviewed and stable  Post vital signs: Reviewed and stable  Last Vitals:  Vitals Value Taken Time  BP 130/82 09/16/20 1712  Temp    Pulse 64 09/16/20 1713  Resp 13 09/16/20 1713  SpO2 95 % 09/16/20 1713  Vitals shown include unvalidated device data.  Last Pain:  Vitals:   09/16/20 1411  TempSrc: Oral  PainSc:          Complications: No complications documented.

## 2020-09-16 NOTE — ED Notes (Signed)
Pt agrees to be driven by employer to [facility] ED to meet with hand surgeon.

## 2020-09-16 NOTE — Anesthesia Postprocedure Evaluation (Signed)
Anesthesia Post Note  Patient: Samuel Khan  Procedure(s) Performed: IRRIGATION AND DEBRIDEMENT RIGHT HAND (Right )     Patient location during evaluation: PACU Anesthesia Type: General Level of consciousness: awake and alert, patient cooperative and oriented Pain management: pain level controlled Vital Signs Assessment: post-procedure vital signs reviewed and stable Respiratory status: spontaneous breathing, nonlabored ventilation and respiratory function stable Cardiovascular status: blood pressure returned to baseline and stable Postop Assessment: no apparent nausea or vomiting Anesthetic complications: no   No complications documented.  Last Vitals:  Vitals:   09/16/20 1411 09/16/20 1715  BP: 121/81 130/82  Pulse: 68 65  Resp: 16 13  Temp: 36.7 C 36.6 C  SpO2: 100% 97%    Last Pain:  Vitals:   09/16/20 1715  TempSrc:   PainSc: 0-No pain                 Simranjit Thayer,E. Bianna Haran

## 2020-09-16 NOTE — Anesthesia Procedure Notes (Signed)
Procedure Name: LMA Insertion Date/Time: 09/16/2020 4:42 PM Performed by: Drema Pry, CRNA Pre-anesthesia Checklist: Patient identified, Emergency Drugs available, Suction available and Patient being monitored Patient Re-evaluated:Patient Re-evaluated prior to induction Oxygen Delivery Method: Circle System Utilized Preoxygenation: Pre-oxygenation with 100% oxygen Induction Type: IV induction Ventilation: Mask ventilation without difficulty LMA: LMA inserted LMA Size: 4.0 Number of attempts: 1 Airway Equipment and Method: Bite block Placement Confirmation: positive ETCO2 Tube secured with: Tape Dental Injury: Teeth and Oropharynx as per pre-operative assessment

## 2020-09-16 NOTE — ED Notes (Signed)
ED Provider at bedside. 

## 2020-09-16 NOTE — ED Provider Notes (Signed)
Patient transferred from [facility] for right hand injury.  He has large wooden foreign body between the third and fourth fingers.  I have consulted with orthopedics who will assume care of the patient for appropriate foreign body removal.   Arthor Captain, PA-C 09/17/20 1331    Lorre Nick, MD 09/18/20 1536

## 2020-09-16 NOTE — Anesthesia Preprocedure Evaluation (Addendum)
Anesthesia Evaluation  Patient identified by MRN, date of birth, ID band Patient awake    Reviewed: Allergy & Precautions, NPO status , Patient's Chart, lab work & pertinent test results, reviewed documented beta blocker date and time   History of Anesthesia Complications Negative for: history of anesthetic complications  Airway Mallampati: II  TM Distance: >3 FB Neck ROM: Full    Dental  (+) Poor Dentition, Missing, Chipped, Dental Advisory Given   Pulmonary COPD (hasn't needed inhaler in over a year), Current SmokerPatient did not abstain from smoking.,  09/16/2020 SARS coronavirus NEG   breath sounds clear to auscultation       Cardiovascular hypertension, Pt. on medications and Pt. on home beta blockers (-) angina+ dysrhythmias Atrial Fibrillation  Rhythm:Regular Rate:Normal  12/04/2020 ECHO: rapid Afib with EF 30-35%.  Severe globalLV hypokinesis.  Diastolic function is indeterminate, probably abnormal.  Mildly dilated RV chamber.  Normal RV systolic function.  Moderately dilated left atrium.  Mild-to-moderate, eccentric mitral regurgitation    Neuro/Psych  Headaches, Anxiety Depression    GI/Hepatic negative GI ROS, (+)     substance abuse  alcohol use,   Endo/Other  negative endocrine ROS  Renal/GU negative Renal ROS     Musculoskeletal   Abdominal   Peds  Hematology negative hematology ROS (+)   Anesthesia Other Findings   Reproductive/Obstetrics                           Anesthesia Physical Anesthesia Plan  ASA: III  Anesthesia Plan: General   Post-op Pain Management:    Induction: Intravenous  PONV Risk Score and Plan: 1 and Ondansetron and Dexamethasone  Airway Management Planned: LMA  Additional Equipment: None  Intra-op Plan:   Post-operative Plan:   Informed Consent: I have reviewed the patients History and Physical, chart, labs and discussed the procedure  including the risks, benefits and alternatives for the proposed anesthesia with the patient or authorized representative who has indicated his/her understanding and acceptance.     Dental advisory given  Plan Discussed with: CRNA and Surgeon  Anesthesia Plan Comments:        Anesthesia Quick Evaluation

## 2020-09-17 ENCOUNTER — Encounter (HOSPITAL_COMMUNITY): Payer: Self-pay | Admitting: General Surgery

## 2020-09-17 NOTE — Op Note (Signed)
Samuel Khan, Samuel Khan MEDICAL RECORD NO: [id number] ACCOUNT NO: [id number] DATE OF BIRTH: 03/11/1971 FACILITY: [facility] LOCATION: [facility] PHYSICIAN: Carnell Casamento C. Izora Ribas, MD  Operative Report   DATE OF PROCEDURE: 09/16/2020  PREOPERATIVE DIAGNOSIS:  Foreign body of the left hand.  POSTOPERATIVE DIAGNOSIS:  Foreign body of the left hand.  PROCEDURE:  Removal of wooden foreign body from left hand.  ANESTHESIA:  General.  COMPLICATIONS:  No acute complications.  ESTIMATED BLOOD LOSS:  Minimal.  INDICATIONS:  The patient is a 50 year old that was moving some wood in pieces this afternoon and there was a piece of wood that was impaled into his hand.  He attempted to remove part of the object; however, there is a retained part that is still  present.  Risks, benefits and alternatives of surgical removal were discussed with him.  He agreed with this course of action.  Consent was obtained.  DESCRIPTION OF PROCEDURE:  The patient was taken to the operating room and placed supine on the operating room table.  Anesthesia was administered without difficulty.  A timeout was performed.  The hand was prepped and draped in normal sterile fashion.   The object was palpable in the palm of the hand and a small incision was made overlying this object.  Careful dissection was used to isolate the foreign body.  The foreign body was a piece of wood that had splintered into a couple different pieces.  Each  of the large pieces were removed and then thorough exploration was done to completely remove all additional pieces.  Afterwards, the entry point and incision were thoroughly irrigated with saline solution.  Again, the wound was inspected for additional  fragments and none were found.  A single 5-0 nylon suture was placed to approximate the wound.  The wound was splinted open with a small piece of Xeroform and a sterile dressing was applied.  A 10 mL of Marcaine plain were infiltrated around the site for   postoperative pain control.     PAA D: 09/16/2020 6:04:14 pm T: 09/17/2020 5:52:00 am  JOB: [id number]/ [id number]
